# Patient Record
Sex: Male | Born: 2000 | Race: White | Hispanic: No | Marital: Single | Smoking: Never smoker
Health system: Southern US, Community
[De-identification: ages and names within clinical notes are randomized; demographics above are authoritative.]

## PROBLEM LIST (undated history)

## (undated) HISTORY — PX: HERNIA REPAIR: SHX51

## (undated) HISTORY — PX: ADENOIDECTOMY: SUR15

---
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
## 2022-08-15 ENCOUNTER — Ambulatory Visit
Admission: EM | Admit: 2022-08-15 | Discharge: 2022-08-15 | Disposition: A | Discharge status: Home / Self Care | Payer: BC Managed Care – PPO | Attending: Emergency Medicine | Admitting: Emergency Medicine

## 2022-08-15 DIAGNOSIS — R1031 Right lower quadrant pain: Secondary | ICD-10-CM | POA: Diagnosis present

## 2022-08-15 LAB — CBC WITH DIFFERENTIAL/PLATELET
Abs Immature Granulocytes: 0.04 10*3/uL (ref 0.00–0.07)
Basophils Absolute: 0 10*3/uL (ref 0.0–0.1)
Basophils Relative: 0 %
Eosinophils Absolute: 0 10*3/uL (ref 0.0–0.5)
Eosinophils Relative: 0 %
HCT: 43.2 % (ref 39.0–52.0)
Hemoglobin: 15 g/dL (ref 13.0–17.0)
Immature Granulocytes: 0 %
Lymphocytes Relative: 9 %
Lymphs Abs: 1 10*3/uL (ref 0.7–4.0)
MCH: 32.5 pg (ref 26.0–34.0)
MCHC: 34.7 g/dL (ref 30.0–36.0)
MCV: 93.5 fL (ref 80.0–100.0)
Monocytes Absolute: 0.9 10*3/uL (ref 0.1–1.0)
Monocytes Relative: 8 %
Neutro Abs: 9.3 10*3/uL — ABNORMAL HIGH (ref 1.7–7.7)
Neutrophils Relative %: 83 %
Platelets: 179 10*3/uL (ref 150–400)
RBC: 4.62 MIL/uL (ref 4.22–5.81)
RDW: 11.9 % (ref 11.5–15.5)
WBC: 11.3 10*3/uL — ABNORMAL HIGH (ref 4.0–10.5)
nRBC: 0 % (ref 0.0–0.2)

## 2022-08-15 NOTE — Discharge Instructions (Addendum)
Please go to the nearest emergency department for further evaluation and management due to placement of abdominal pain

## 2022-08-15 NOTE — ED Provider Notes (Signed)
Patient presents for evaluation of abdominal pain that began 1 day ago.  Initially was generalized but today has become localized to the right lower quadrant.  Described as a aching pain, constant, fluctuating in intensity, exacerbated by movement such as changing in positions.  Associated nausea but denies vomiting.  Associated bloating denies heartburn or indigestion.  Last bowel movement 1 day ago, some straining present.  Endorses earlier this week did have blood on the tissue with a bowel movement, 1 occurrence.  No known sick contacts.  Denies fevers or URI symptoms.  Has attempted use of Aleve which has been minimally effective.  Has all organs.  Did eat [employer] for the first time prior to symptoms beginning.  Patient having tenderness in the right lower quadrant on exam, CBC showing elevation in Pedro Gilbert blood cell count to 11.3, sent to the nearest emergency department for further evaluation and workup as expected poisoning should be more localized to the epigastric region.    Pedro Hoar, NP 08/15/22 [phone]

## 2022-08-15 NOTE — ED Triage Notes (Signed)
Pt c/o right lower abdomen pain x 2 days. Pt reports this morning her woke up with some nausea this morning.  Patient reports some blood in his stool 2 days ago.
